# Patient Record
Sex: Male | Born: 2005 | Race: White | Hispanic: No | Marital: Single | Smoking: Never smoker
Health system: Southern US, Community
[De-identification: ages and names within clinical notes are randomized; demographics above are authoritative.]

## PROBLEM LIST (undated history)

## (undated) DIAGNOSIS — J020 Streptococcal pharyngitis: Secondary | ICD-10-CM

---
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
## 2023-01-18 ENCOUNTER — Encounter: Payer: Self-pay | Admitting: Neurology

## 2023-01-18 ENCOUNTER — Ambulatory Visit (INDEPENDENT_AMBULATORY_CARE_PROVIDER_SITE_OTHER): Payer: 59 | Admitting: Neurology

## 2023-01-18 VITALS — BP 107/66 | HR 68 | Ht 68.0 in | Wt 127.0 lb

## 2023-01-18 DIAGNOSIS — K219 Gastro-esophageal reflux disease without esophagitis: Secondary | ICD-10-CM

## 2023-01-18 DIAGNOSIS — G4711 Idiopathic hypersomnia with long sleep time: Secondary | ICD-10-CM | POA: Diagnosis not present

## 2023-01-18 DIAGNOSIS — M5440 Lumbago with sciatica, unspecified side: Secondary | ICD-10-CM

## 2023-01-18 DIAGNOSIS — R0681 Apnea, not elsewhere classified: Secondary | ICD-10-CM

## 2023-01-18 MED ORDER — FAMOTIDINE 20 MG PO TABS: 20.0000 mg | ORAL_TABLET | Freq: Two times a day (BID) | ORAL | 0 refills | Status: AC
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
## 2023-01-24 ENCOUNTER — Encounter: Payer: Self-pay | Admitting: Neurology

## 2023-01-26 ENCOUNTER — Ambulatory Visit: Payer: 59 | Admitting: Neurology

## 2023-01-26 DIAGNOSIS — F518 Other sleep disorders not due to a substance or known physiological condition: Secondary | ICD-10-CM

## 2023-01-26 DIAGNOSIS — G4733 Obstructive sleep apnea (adult) (pediatric): Secondary | ICD-10-CM | POA: Diagnosis not present

## 2023-01-26 DIAGNOSIS — M5440 Lumbago with sciatica, unspecified side: Secondary | ICD-10-CM

## 2023-01-26 DIAGNOSIS — G4711 Idiopathic hypersomnia with long sleep time: Secondary | ICD-10-CM

## 2023-01-26 DIAGNOSIS — K219 Gastro-esophageal reflux disease without esophagitis: Secondary | ICD-10-CM

## 2023-01-26 DIAGNOSIS — Z9189 Other specified personal risk factors, not elsewhere classified: Secondary | ICD-10-CM

## 2023-01-26 DIAGNOSIS — G4719 Other hypersomnia: Secondary | ICD-10-CM

## 2023-01-27 LAB — NARCOLEPSY EVALUATION
DQA1*01:02: POSITIVE
DQB1*06:02: POSITIVE

## 2023-01-27 NOTE — Progress Notes (Signed)
Double positive narcolepsy allel.  The patient should have MSLT and PSG to follow.

## 2023-01-28 ENCOUNTER — Encounter: Payer: Self-pay | Admitting: Neurology

## 2023-02-03 NOTE — Progress Notes (Signed)
[facility]   HOME SLEEP TEST REPORT ( MAIL -out by Watch PAT)   STUDY DATA:  02-03-2023  Gordon Ayala "Gordon Ayala" 17 year old male [birth year]    ORDERING CLINICIAN: Melvyn Novas, MD  REFERRING CLINICIAN: Dr Gordon Ayala/ Dr Gordon Ayala   CLINICAL INFORMATION/HISTORY: 01-18-2023: 17 year-old male  patient here with both parents ( Amy and Harvie Heck)  for evaluation of non-restorative sleep, EDS, verus idiopathic hypersomnia, he just aged out of his pediatric provider's patient group, now followed by Dr Gordon Ayala.  Chief concern according to patient :  " for about a year now I am sleepier than ever before" .  It doesn't matter when I go to bed , I feel just tired all the time: my sleep habits have deteriorated. I have slept in class".  He feels I that is nocturnal sleep is solid, but :" I can sleep 10 hours and be unrestored- I have trouble to get up in time for school. "  The patient goes to bed between 10-12 PM and continues to sleep for 7-10 hours. The preferred sleep position is supine since his spine injuries. Bedroom is cool, quiet and dark, no screen in bedroom, sleeps with the support of 1 pillow. Nasal airflow barely patent on the right .Dreams are reportedly rare (!!) .  The patient wakes up much too late when waking spontaneously (around 1 Pm ), and has trouble to wake with an alarm,needs multiple alarm settings. Has missed the 8  AM usual rise time for college and school.  He reports not feeling refreshed or restored in AM, with symptoms such as dry mouth, morning headaches ( dull) , and always has felt residually fatigued.   Gordon Ayala " Gordon Ayala" Gordon Ayala reports sudden onset of sleep inertia during his senior year- some milder sleepiness was noted during COVID pandemic while at home. Infection in January 2021 before vaccination was available.  He wasn't sleepy in middle school though-. He is snoring. He reports no vivid dreams , no sleep paralysis, no hallucinations.  He then added late in this visit   that he only recalls dreams in short naps, therefor an  HLA test for narcolepsy was ordered and the test returned positive.     Epworth sleepiness score: 16/24. ( !) and FSS at  36/ 63  points.  BMI:  19.4 kg/m   Neck Circumference: 15"   FINDINGS:   Sleep Summary:   Total Recording Time (hours, min): 9 hours 59 minutes  Total Sleep Time (hours, min):     8 hours 59 minutes            Percent REM (%): 26.2%      REM sleep latency according to this home sleep test device was 64 minutes, sleep latency was 16 minutes, there were 43 minutes of wakefulness after initial sleep onset, 7 wakefulness  periods.                                Respiratory Indices:   Calculated pAHI (per hour): 6.4/h                           REM pAHI: 9/h  NREM pAHI: 5.4/h  The RDI for the total night was 19.3, during REM sleep 28.1/h during non-REM sleep 16.1/h and this would be indicative of snoring or some upper airway resistance.                            Supine AHI:     The patient slept for about 260 minutes in supine associated with an AHI of 4.4, he slept 100 minutes on his right side with an AHI of 9.1, 111 minutes in prone position with an AHI of 6.5 at 70 minutes on his left with an AHI of 9.5/h.  So he actually had a unexpected lower apnea hypopnea index when sleeping supine.  Snoring statistics showed a mean volume of 41 dB and snoring to be present for well over half of the total sleep time.                                              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 I have no doubts that this mild apnea alone is not cause for this young mans sleepiness but need to treat any other sleep disorder that could cause sleepiness .   In order to obtain a valid narcolepsy sleep test, I will have to place this young patient on a CPAP-   I need him to compliantly use his CPAP until he can come to the lab for his daytime sleepiness study (MSLT) .  This will usually follow a nocturnal PSG and consists of 5 naps.  The opportunity to nap is given every 2 hours for about 20 minutes.  We measure the latency towards sleep onset and REM sleep onset- Narcolepsy is diagnosed if REM sleep is seen within the charted naps ,   The diagnosis of narcolepsy is based on the MSLT, not on the positive HLA test.    The HLA test only tells [place] that there is a genetic predisposition to develop narcolepsy -but not if narcolepsy is now present.  It would not be accepted by his insurance company to treat him for narcolepsy.   RECOMMENDATION: 1) We need you  apnea free- please start auto CPAP - 5-15 cm water, humidified  with 3 cm water EPR,  mask of patient's comfort.  I will schedule the follow up tests for within the next 40 days , PSG on CPAP with MSLT. Weaned off all REM suppressant medications. Patient needs testing while on Summer-break.    INTERPRETING PHYSICIAN:   Gordon Novas, MD @ [facility]  and [employer] certified by the [employer] of Sleep Medicine and  Diplomate of the [employer] of Sleep Medicine. Board certified in Neurology through the ABPN,  Fellow of the [employer] of Neurology. Accredited Member of the [employer] , [employer] of Sleep Medicine.

## 2023-02-05 DIAGNOSIS — G4719 Other hypersomnia: Secondary | ICD-10-CM | POA: Insufficient documentation

## 2023-02-05 DIAGNOSIS — F518 Other sleep disorders not due to a substance or known physiological condition: Secondary | ICD-10-CM | POA: Insufficient documentation

## 2023-02-05 NOTE — Procedures (Signed)
[facility]   HOME SLEEP TEST REPORT ( MAIL -out by Watch PAT)   STUDY DATA:  02-03-2023  Gordon Stmarie "[name]" 17 year old male [birth year]    ORDERING CLINICIAN: Melvyn Novas, MD  REFERRING CLINICIAN: Dr Puzio/ Dr Eloise Harman   CLINICAL INFORMATION/HISTORY: 01-18-2023: 17 year-old male  patient here with both parents ( Amy and Harvie Heck)  for evaluation of non-restorative sleep, EDS, verus idiopathic hypersomnia, he just aged out of his pediatric provider's patient group, now followed by Dr Eloise Harman.  Chief concern according to patient :  " for about a year now I am sleepier than ever before" .  It doesn't matter when I go to bed , I feel just tired all the time: my sleep habits have deteriorated. I have slept in class".  He feels I that is nocturnal sleep is solid, but :" I can sleep 10 hours and be unrestored- I have trouble to get up in time for school. "  The patient goes to bed between 10-12 PM and continues to sleep for 7-10 hours. The preferred sleep position is supine since his spine injuries. Bedroom is cool, quiet and dark, no screen in bedroom, sleeps with the support of 1 pillow. Nasal airflow barely patent on the right .Dreams are reportedly rare (!!) .  The patient wakes up much too late when waking spontaneously (around 1 Pm ), and has trouble to wake with an alarm,needs multiple alarm settings. Has missed the 8  AM usual rise time for college and school.  He reports not feeling refreshed or restored in AM, with symptoms such as dry mouth, morning headaches ( dull) , and always has felt residually fatigued.   Gordon Ayala reports sudden onset of sleep inertia during his senior year- some milder sleepiness was noted during COVID pandemic while at home. Infection in January 2021 before vaccination was available.  He wasn't sleepy in middle school though-. He is snoring. He reports no vivid dreams , no sleep paralysis, no hallucinations.  He then added late in this visit   that he only recalls dreams in short naps, therefor an  HLA test for narcolepsy was ordered and the test returned positive.     Epworth sleepiness score: 16/24. ( !) and FSS at  36/ 63  points.  BMI:  19.4 kg/m   Neck Circumference: 15"   FINDINGS:   Sleep Summary:   Total Recording Time (hours, min): 9 hours 59 minutes  Total Sleep Time (hours, min):     8 hours 59 minutes            Percent REM (%): 26.2%      REM sleep latency according to this home sleep test device was 64 minutes, sleep latency was 16 minutes, there were 43 minutes of wakefulness after initial sleep onset, 7 wakefulness  periods.                                Respiratory Indices:   Calculated pAHI (per hour): 6.4/h                           REM pAHI: 9/h  NREM pAHI: 5.4/h  The RDI for the total night was 19.3, during REM sleep 28.1/h during non-REM sleep 16.1/h and this would be indicative of snoring or some upper airway resistance.                            Supine AHI:     The patient slept for about 260 minutes in supine associated with an AHI of 4.4, he slept 100 minutes on his right side with an AHI of 9.1, 111 minutes in prone position with an AHI of 6.5 at 70 minutes on his left with an AHI of 9.5/h.  So he actually had a unexpected lower apnea hypopnea index when sleeping supine.  Snoring statistics showed a mean volume of 41 dB and snoring to be present for well over half of the total sleep time.                                              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 I have no doubts that this mild apnea alone is not cause for this young mans sleepiness but need to treat any other sleep disorder that could cause sleepiness .   In order to obtain a valid narcolepsy sleep test, I will have to place this young patient on a CPAP-   I need him to compliantly use his CPAP until he can come to the lab for his daytime sleepiness study (MSLT) .  This will usually follow a nocturnal PSG and consists of 5 naps.  The opportunity to nap is given every 2 hours for about 20 minutes.  We measure the latency towards sleep onset and REM sleep onset- Narcolepsy is diagnosed if REM sleep is seen within the charted naps ,   The diagnosis of narcolepsy is based on the MSLT, not on the positive HLA test.    The HLA test only tells [place] that there is a genetic predisposition to develop narcolepsy -but not if narcolepsy is now present.  It would not be accepted by his insurance company to treat him for narcolepsy.   RECOMMENDATION: 1) We need you  apnea free- please start auto CPAP - 5-15 cm water, humidified  with 3 cm water EPR,  mask of patient's comfort.  I will schedule the follow up tests for within the next 40 days , PSG on CPAP with MSLT. Weaned off all REM suppressant medications. Patient needs testing while on Summer-break.    INTERPRETING PHYSICIAN:   Melvyn Novas, MD @ [facility]  and [employer] certified by the [employer] of Sleep Medicine and  Diplomate of the [employer] of Sleep Medicine. Board certified in Neurology through the ABPN,  Fellow of the [employer] of Neu[employer]. Accredited Member of the [employer] , [employer] of Sleep Medicine.

## 2023-02-09 ENCOUNTER — Telehealth: Payer: Self-pay | Admitting: Neurology

## 2023-02-09 NOTE — Telephone Encounter (Signed)
[employer] pending uploaded notes  

## 2023-02-10 ENCOUNTER — Encounter: Payer: Self-pay | Admitting: Neurology

## 2023-02-17 NOTE — Telephone Encounter (Signed)
NPSG/MSLT [employer] Berkley Harvey: [id number] (exp. 02/09/23 to 05/10/23)

## 2023-02-17 NOTE — Telephone Encounter (Signed)
I spoke with the patient mother Amy and she expressed her frustrations with me how she is very upset how they asked for the doctor to order a in lab to begin with and not a home sleep study. She stated she knows her insurance would have approved the in lab and why is it now just being approved. I explained to her that a lot of times insurance want to see a home sleep study and if it is not enough detail then they approve a in lab. She said that is not true and that she is going to call her insurance and ask them if they would have approved an in lab from the beginning.   She also informed me that her son does not want to do this anymore because they have felt that we have let them down and they believe that he does not have narcolepsy and does not sleep during the day. She stated he will not do the CPAP at all. She stated now you have a very frustrated mother that now her son does not want to do anything because we have let them down by not doing what they asked for from the beginning. She stated he just graduated college and is going to become a Psychologist, occupational and travel the [place] and he will not have time to do all of this.   She stated they are going to see an ENT today and that she is going to request for a CT of the face to see if he has narrow airways/or something with his nose but she stated now if he has to have surgery its going to be hard to convince him to do that because of how we again left them down.   She asked me how far am I scheduling sleep studies out too and I informed her November and she laughed at me and said well there you go he is definitely isn't going to do that.   She stated to me that I can pass all this information forward.

## 2023-03-02 ENCOUNTER — Telehealth: Payer: Self-pay | Admitting: Neurology

## 2023-03-02 NOTE — Telephone Encounter (Signed)
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 I have no doubts that this mild apnea alone is not cause for this young mans sleepiness but need to treat any other sleep disorder that could cause sleepiness .   In order to obtain a valid narcolepsy sleep test, I will have to place this young patient on a CPAP-   I need him to compliantly use his CPAP until he can come to the lab for his daytime sleepiness study (MSLT) .  This will usually follow a nocturnal PSG and consists of 5 naps.  The opportunity to nap is given every 2 hours for about 20 minutes.  We measure the latency towards sleep onset and REM sleep onset- Narcolepsy is diagnosed if REM sleep is seen within the charted naps ,   The diagnosis of narcolepsy is based on the MSLT, not on the positive HLA test.

## 2023-03-05 NOTE — Telephone Encounter (Addendum)
We can only offer to follow up if he wishes to - and we will not follow up now as he declared not to be wanting further testing. The patient is for now seeing ENT physician Dr Marene Lenz.   Cc Dr Talmage Nap.    Dear Dr Marene Lenz / Dr Talmage Nap:  The Home Sleep Test confirms the presence of very mild obstructive sleep apnea. I have no doubts that this mild apnea alone is NOT the cause for this young mans sleepiness but need to treat any other sleep disorder that could cause sleepiness .   In order to obtain a valid narcolepsy sleep test, I will have to place this young patient on a CPAP-   I need him to compliantly use his CPAP until he can come to the lab for his daytime sleepiness study (MSLT) .  This will usually follow a nocturnal PSG and consists of 5 naps.  The opportunity to nap is given every 2 hours for about 20 minutes.  We measure the latency towards sleep onset and REM sleep onset- Narcolepsy is diagnosed if REM sleep is seen within the charted naps ,   The diagnosis of narcolepsy is based on the MSLT, not on the positive HLA test.        Melvyn Novas, MD

## 2024-07-28 ENCOUNTER — Telehealth: Payer: Self-pay | Admitting: Neurology

## 2024-07-28 NOTE — Telephone Encounter (Signed)
 Pt mother called to request to speak to MD about Pt staring up to get Cpap. Pt mother stated that  back in 2024 Pt  and MD discuss getting on CPAP machine but pt  was not ready.. Pt mother stated Pt is request to start process to get CPAP machine.

## 2024-07-31 NOTE — Telephone Encounter (Signed)
 Since it has been greater than a year since the patient was seen and order for cpap was placed, the patient will have to be seen in the office first before cpap process can be restarted. Please let pt's mother know. Thank you for scheduling the appt already. I put him on the wait list for Dr Dohmeier in case something sooner opens.

## 2024-08-08 ENCOUNTER — Telehealth: Payer: Self-pay | Admitting: Neurology

## 2024-08-08 NOTE — Telephone Encounter (Signed)
 LVM and mychart message informing pt of reschedule needed.. MD OUT

## 2024-09-20 ENCOUNTER — Ambulatory Visit: Admitting: Neurology
